# Patient Record
Sex: Female | Born: 1990 | Race: Black or African American | Hispanic: No | Marital: Married | Smoking: Current every day smoker
Health system: Southern US, Community
[De-identification: ages and names within clinical notes are randomized; demographics above are authoritative.]

---

## 2016-12-04 ENCOUNTER — Emergency Department (HOSPITAL_COMMUNITY)
Admission: EM | Admit: 2016-12-04 | Discharge: 2016-12-05 | Disposition: A | Discharge status: Home / Self Care | Payer: Medicaid Other | Attending: Emergency Medicine | Admitting: Emergency Medicine

## 2016-12-04 ENCOUNTER — Encounter (HOSPITAL_COMMUNITY): Payer: Self-pay | Admitting: Emergency Medicine

## 2016-12-04 DIAGNOSIS — Y999 Unspecified external cause status: Secondary | ICD-10-CM | POA: Diagnosis not present

## 2016-12-04 DIAGNOSIS — Y929 Unspecified place or not applicable: Secondary | ICD-10-CM | POA: Diagnosis not present

## 2016-12-04 DIAGNOSIS — S199XXA Unspecified injury of neck, initial encounter: Secondary | ICD-10-CM | POA: Insufficient documentation

## 2016-12-04 DIAGNOSIS — W03XXXA Other fall on same level due to collision with another person, initial encounter: Secondary | ICD-10-CM | POA: Diagnosis not present

## 2016-12-04 DIAGNOSIS — S3992XA Unspecified injury of lower back, initial encounter: Secondary | ICD-10-CM | POA: Diagnosis present

## 2016-12-04 DIAGNOSIS — Y9389 Activity, other specified: Secondary | ICD-10-CM | POA: Diagnosis not present

## 2016-12-04 DIAGNOSIS — S3982XA Other specified injuries of lower back, initial encounter: Secondary | ICD-10-CM | POA: Diagnosis not present

## 2016-12-04 DIAGNOSIS — T148XXA Other injury of unspecified body region, initial encounter: Secondary | ICD-10-CM

## 2016-12-04 DIAGNOSIS — F17210 Nicotine dependence, cigarettes, uncomplicated: Secondary | ICD-10-CM | POA: Diagnosis not present

## 2016-12-04 LAB — POC URINE PREG, ED: Preg Test, Ur: NEGATIVE

## 2016-12-04 MED ORDER — NAPROXEN 250 MG PO TABS
500.0000 mg | ORAL_TABLET | Freq: Once | ORAL | Status: AC
  Administered 2016-12-04: 500 mg via ORAL
  Filled 2016-12-04: qty 2

## 2016-12-04 MED ORDER — METHOCARBAMOL 500 MG PO TABS
500.0000 mg | ORAL_TABLET | Freq: Once | ORAL | Status: AC
  Administered 2016-12-04: 500 mg via ORAL
  Filled 2016-12-04: qty 1
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
## 2016-12-05 ENCOUNTER — Emergency Department (HOSPITAL_COMMUNITY): Payer: Medicaid Other

## 2016-12-05 MED ORDER — NAPROXEN 500 MG PO TABS: 500.0000 mg | ORAL_TABLET | Freq: Two times a day (BID) | ORAL | 0 refills | Status: AC

## 2016-12-05 MED ORDER — METHOCARBAMOL 500 MG PO TABS: 500.0000 mg | ORAL_TABLET | Freq: Two times a day (BID) | ORAL | 0 refills | Status: DC

## 2016-12-05 NOTE — ED Notes (Signed)
Pt to xray

## 2017-01-21 ENCOUNTER — Encounter (HOSPITAL_COMMUNITY): Payer: Self-pay | Admitting: Emergency Medicine

## 2017-01-21 ENCOUNTER — Emergency Department (HOSPITAL_COMMUNITY)
Admission: EM | Admit: 2017-01-21 | Discharge: 2017-01-21 | Disposition: A | Discharge status: Home / Self Care | Payer: Medicaid Other | Attending: Emergency Medicine | Admitting: Emergency Medicine

## 2017-01-21 DIAGNOSIS — G44219 Episodic tension-type headache, not intractable: Secondary | ICD-10-CM | POA: Insufficient documentation

## 2017-01-21 DIAGNOSIS — R5383 Other fatigue: Secondary | ICD-10-CM | POA: Insufficient documentation

## 2017-01-21 DIAGNOSIS — F17210 Nicotine dependence, cigarettes, uncomplicated: Secondary | ICD-10-CM | POA: Insufficient documentation

## 2017-01-21 DIAGNOSIS — R51 Headache: Secondary | ICD-10-CM | POA: Diagnosis present

## 2017-01-21 LAB — I-STAT CHEM 8, ED
BUN: 14 mg/dL (ref 6–20)
CHLORIDE: 109 mmol/L (ref 101–111)
Calcium, Ion: 1.11 mmol/L — ABNORMAL LOW (ref 1.15–1.40)
Creatinine, Ser: 0.7 mg/dL (ref 0.44–1.00)
Glucose, Bld: 76 mg/dL (ref 65–99)
HEMATOCRIT: 40 % (ref 36.0–46.0)
HEMOGLOBIN: 13.6 g/dL (ref 12.0–15.0)
POTASSIUM: 4.3 mmol/L (ref 3.5–5.1)
Sodium: 140 mmol/L (ref 135–145)
TCO2: 24 mmol/L (ref 0–100)

## 2017-01-21 LAB — I-STAT BETA HCG BLOOD, ED (MC, WL, AP ONLY)

## 2017-01-21 MED ORDER — ACETAMINOPHEN 500 MG PO TABS
1000.0000 mg | ORAL_TABLET | Freq: Once | ORAL | Status: AC
  Administered 2017-01-21: 1000 mg via ORAL
  Filled 2017-01-21: qty 2

## 2017-01-21 NOTE — ED Provider Notes (Signed)
[facility]-EMERGENCY DEPT Provider Note   CSN: [id number] Arrival date & time: 01/21/17  0755     History   Chief Complaint Chief Complaint  Patient presents with  . Headache  . Fatigue    HPI Alicia Hopkins is a 26 y.o. female.  The history is provided by the patient.  Headache   This is a recurrent problem. The current episode started yesterday. The problem occurs constantly. The problem has not changed since onset.The headache is associated with an unknown factor. The pain is located in the right unilateral region. The quality of the pain is described as dull. The pain is moderate. The pain does not radiate. Associated symptoms include malaise/fatigue and nausea. Pertinent negatives include no anorexia, no fever, no chest pressure, no near-syncope, no orthopnea, no palpitations, no syncope, no shortness of breath and no vomiting. She has tried aspirin for the symptoms. The treatment provided no relief.   States that she had a small amount to drink Friday and has been feeling fatigued since then. Reports not hydrating well.  LMP 8/1 which lasted 2 weeks until 3 days ago.  History reviewed. No pertinent past medical history.  There are no active problems to display for this patient.   History reviewed. No pertinent surgical history.  OB History    No data available       Home Medications    Prior to Admission medications   Medication Sig Start Date End Date Taking? Authorizing Provider  aspirin 500 MG tablet Take 500 mg by mouth every 6 (six) hours as needed for pain or fever.   Yes [provider]  methocarbamol (ROBAXIN) 500 MG tablet Take 1 tablet (500 mg total) by mouth 2 (two) times daily. Patient not taking: Reported on 01/21/2017 12/05/16   Antony Madura, PA-C  naproxen (NAPROSYN) 500 MG tablet Take 1 tablet (500 mg total) by mouth 2 (two) times daily. Patient not taking: Reported on 01/21/2017 12/05/16   Antony Madura, PA-C    Family History History reviewed.  No pertinent family history.  Social History Social History  Substance Use Topics  . Smoking status: Current Every Day Smoker    Packs/day: 0.50    Types: Cigarettes  . Smokeless tobacco: Never Used  . Alcohol use No     Allergies   Patient has no known allergies.   Review of Systems Review of Systems  Constitutional: Positive for fatigue and malaise/fatigue. Negative for chills and fever.  HENT: Negative for ear pain and sore throat.   Eyes: Negative for pain and visual disturbance.  Respiratory: Negative for cough and shortness of breath.   Cardiovascular: Negative for chest pain, palpitations, orthopnea, syncope and near-syncope.  Gastrointestinal: Positive for nausea. Negative for abdominal pain, anorexia and vomiting.  Genitourinary: Negative for dysuria and hematuria.  Musculoskeletal: Negative for arthralgias and back pain.  Skin: Negative for color change and rash.  Neurological: Positive for headaches. Negative for seizures and syncope.  All other systems reviewed and are negative. All other systems are reviewed and are negative for acute change except as noted in the HPI    Physical Exam Updated Vital Signs BP 123/72   Pulse 61   Temp 97.8 F (36.6 C)   Resp 14   LMP 01/07/2017 (Approximate)   SpO2 100%   Physical Exam  Constitutional: She is oriented to person, place, and time. She appears well-developed and well-nourished. No distress.  HENT:  Head: Normocephalic and atraumatic.  Nose: Nose normal.  Eyes: Pupils are  equal, round, and reactive to light. Conjunctivae and EOM are normal. Right eye exhibits no discharge. Left eye exhibits no discharge. No scleral icterus.  Neck: Normal range of motion. Neck supple.  Cardiovascular: Normal rate and regular rhythm.  Exam reveals no gallop and no friction rub.   No murmur heard. Pulmonary/Chest: Effort normal and breath sounds normal. No stridor. No respiratory distress. She has no rales.  Abdominal: Soft.  She exhibits no distension. There is no tenderness.  Musculoskeletal: She exhibits no edema or tenderness.  Neurological: She is alert and oriented to person, place, and time.  Mental Status: Alert and oriented to person, place, and time. Attention and concentration normal. Speech clear. Recent memory is intact  Cranial Nerves  II Visual Fields: Intact to confrontation. Visual fields intact. III, IV, VI: Pupils equal and reactive to light and near. Full eye movement without nystagmus  V Facial Sensation: Normal. No weakness of masticatory muscles  VII: No facial weakness or asymmetry  VIII Auditory Acuity: Grossly normal  IX/X: The uvula is midline; the palate elevates symmetrically  XI: Normal sternocleidomastoid and trapezius strength  XII: The tongue is midline. No atrophy or fasciculations.   Motor System: Muscle Strength: 5/5 and symmetric in the upper and lower extremities. No pronation or drift.  Muscle Tone: Tone and muscle bulk are normal in the upper and lower extremities.   Reflexes: DTRs: 1+ and symmetrical in all four extremities. Plantar responses are flexor bilaterally.  Coordination: Intact finger-to-nose, heel-to-shin. No tremor.  Sensation: Intact to light touch, and pinprick.  Gait: Routine and tandem gait normal.    Skin: Skin is warm and dry. No rash noted. She is not diaphoretic. No erythema.  Psychiatric: She has a normal mood and affect.  Vitals reviewed.    ED Treatments / Results  Labs (all labs ordered are listed, but only abnormal results are displayed) Labs Reviewed  I-STAT CHEM 8, ED - Abnormal; Notable for the following:       Result Value   Calcium, Ion 1.11 (*)    All other components within normal limits  I-STAT BETA HCG BLOOD, ED ([facility], [facility], [facility] ONLY)    EKG  EKG Interpretation None       Radiology No results found.  Procedures Procedures (including critical care time)  Medications Ordered in ED Medications  acetaminophen (TYLENOL)  tablet 1,000 mg (1,000 mg Oral Given 01/21/17 0851)     Initial Impression / Assessment and Plan / ED Course  I have reviewed the triage vital signs and the nursing notes.  Pertinent labs & imaging results that were available during my care of the patient were reviewed by me and considered in my medical decision making (see chart for details).     Likely related to poor hydration. No anemia from prolonged menstrual cycle.   Non focal neuro exam. No recent head trauma. No fever. Doubt meningitis. Doubt intracranial bleed. Doubt IIH. No indication for imaging.   Given tylenol and oral hydration.  HCG negative.   The patient is safe for discharge with strict return precautions.     Final Clinical Impressions(s) / ED Diagnoses   Final diagnoses:  Other fatigue  Episodic tension-type headache, not intractable    Disposition: Discharge  Condition: Good  I have discussed the results, Dx and Tx plan with the patient who expressed understanding and agree(s) with the plan. Discharge instructions discussed at great length. The patient was given strict return precautions who verbalized understanding of the instructions. No  further questions at time of discharge.    New Prescriptions   No medications on file    Follow Up: [facility] [address] [place] [address] [phone]  Schedule an appointment as soon as possible for a visit  As needed      Taytum Scheck, Amadeo Garnet, MD 01/21/17 1020

## 2017-01-21 NOTE — ED Notes (Signed)
ED Provider at bedside. 

## 2017-01-21 NOTE — ED Triage Notes (Signed)
Pt states that she has had a headache and general fatigue x 3 days. Neuro intact. Alert and oriented.

## 2017-03-03 ENCOUNTER — Encounter (HOSPITAL_COMMUNITY): Payer: Self-pay

## 2017-03-03 ENCOUNTER — Emergency Department (HOSPITAL_COMMUNITY)
Admission: EM | Admit: 2017-03-03 | Discharge: 2017-03-03 | Disposition: A | Discharge status: Home / Self Care | Payer: Medicaid Other | Attending: Emergency Medicine | Admitting: Emergency Medicine

## 2017-03-03 DIAGNOSIS — F17210 Nicotine dependence, cigarettes, uncomplicated: Secondary | ICD-10-CM | POA: Insufficient documentation

## 2017-03-03 DIAGNOSIS — Y929 Unspecified place or not applicable: Secondary | ICD-10-CM | POA: Insufficient documentation

## 2017-03-03 DIAGNOSIS — W269XXA Contact with unspecified sharp object(s), initial encounter: Secondary | ICD-10-CM | POA: Insufficient documentation

## 2017-03-03 DIAGNOSIS — S81812A Laceration without foreign body, left lower leg, initial encounter: Secondary | ICD-10-CM | POA: Insufficient documentation

## 2017-03-03 DIAGNOSIS — Y939 Activity, unspecified: Secondary | ICD-10-CM | POA: Diagnosis not present

## 2017-03-03 DIAGNOSIS — S81811A Laceration without foreign body, right lower leg, initial encounter: Secondary | ICD-10-CM | POA: Diagnosis not present

## 2017-03-03 DIAGNOSIS — S80922A Unspecified superficial injury of left lower leg, initial encounter: Secondary | ICD-10-CM | POA: Diagnosis present

## 2017-03-03 DIAGNOSIS — Y999 Unspecified external cause status: Secondary | ICD-10-CM | POA: Insufficient documentation

## 2017-03-03 MED ORDER — LIDOCAINE HCL (PF) 1 % IJ SOLN
5.0000 mL | Freq: Once | INTRAMUSCULAR | Status: AC
  Administered 2017-03-03: 5 mL via INTRADERMAL
  Filled 2017-03-03: qty 5
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
## 2017-03-03 NOTE — Discharge Instructions (Signed)
Keep sutures clean and dry. Follow-up with urgent care for suture removal in 7-10 days.

## 2018-03-26 ENCOUNTER — Encounter (HOSPITAL_COMMUNITY): Payer: Self-pay | Admitting: *Deleted

## 2018-03-26 ENCOUNTER — Emergency Department (HOSPITAL_COMMUNITY)
Admission: EM | Admit: 2018-03-26 | Discharge: 2018-03-26 | Disposition: A | Discharge status: Home / Self Care | Payer: Medicaid Other | Attending: Emergency Medicine | Admitting: Emergency Medicine

## 2018-03-26 DIAGNOSIS — K529 Noninfective gastroenteritis and colitis, unspecified: Secondary | ICD-10-CM | POA: Insufficient documentation

## 2018-03-26 DIAGNOSIS — Z79899 Other long term (current) drug therapy: Secondary | ICD-10-CM | POA: Diagnosis not present

## 2018-03-26 DIAGNOSIS — F17210 Nicotine dependence, cigarettes, uncomplicated: Secondary | ICD-10-CM | POA: Insufficient documentation

## 2018-03-26 DIAGNOSIS — R112 Nausea with vomiting, unspecified: Secondary | ICD-10-CM | POA: Diagnosis present

## 2018-03-26 LAB — URINALYSIS, ROUTINE W REFLEX MICROSCOPIC
BILIRUBIN URINE: NEGATIVE
Glucose, UA: NEGATIVE mg/dL
HGB URINE DIPSTICK: NEGATIVE
KETONES UR: NEGATIVE mg/dL
Nitrite: NEGATIVE
Protein, ur: 30 mg/dL — AB
Specific Gravity, Urine: 1.02 (ref 1.005–1.030)
pH: 5 (ref 5.0–8.0)

## 2018-03-26 LAB — COMPREHENSIVE METABOLIC PANEL
ALBUMIN: 4.2 g/dL (ref 3.5–5.0)
ALK PHOS: 53 U/L (ref 38–126)
ALT: 10 U/L (ref 0–44)
ANION GAP: 8 (ref 5–15)
AST: 16 U/L (ref 15–41)
BILIRUBIN TOTAL: 0.8 mg/dL (ref 0.3–1.2)
BUN: 5 mg/dL — AB (ref 6–20)
CALCIUM: 9.3 mg/dL (ref 8.9–10.3)
CO2: 25 mmol/L (ref 22–32)
Chloride: 108 mmol/L (ref 98–111)
Creatinine, Ser: 0.92 mg/dL (ref 0.44–1.00)
GFR calc Af Amer: >60 mL/min (ref >60)
GLUCOSE: 82 mg/dL (ref 70–99)
POTASSIUM: 3.3 mmol/L — AB (ref 3.5–5.1)
Sodium: 141 mmol/L (ref 135–145)
TOTAL PROTEIN: 7.4 g/dL (ref 6.5–8.1)

## 2018-03-26 LAB — CBC
HCT: 41.9 % (ref 36.0–46.0)
HEMOGLOBIN: 13.9 g/dL (ref 12.0–15.0)
MCH: 29.8 pg (ref 26.0–34.0)
MCHC: 33.2 g/dL (ref 30.0–36.0)
MCV: 89.7 fL (ref 80.0–100.0)
PLATELETS: 171 10*3/uL (ref 150–400)
RBC: 4.67 MIL/uL (ref 3.87–5.11)
RDW: 13.3 % (ref 11.5–15.5)
WBC: 5.4 10*3/uL (ref 4.0–10.5)
nRBC: 0 % (ref 0.0–0.2)

## 2018-03-26 LAB — LIPASE, BLOOD: Lipase: 32 U/L (ref 11–51)

## 2018-03-26 LAB — I-STAT BETA HCG BLOOD, ED (MC, WL, AP ONLY): I-stat hCG, quantitative: <5 m[IU]/mL (ref <5)

## 2018-03-26 MED ORDER — ONDANSETRON 4 MG PO TBDP
4.0000 mg | ORAL_TABLET | Freq: Once | ORAL | Status: AC
  Administered 2018-03-26: 4 mg via ORAL
  Filled 2018-03-26: qty 1

## 2018-03-26 MED ORDER — ONDANSETRON HCL 4 MG PO TABS: 4.0000 mg | ORAL_TABLET | Freq: Four times a day (QID) | ORAL | 0 refills | Status: AC

## 2018-03-26 MED ORDER — MORPHINE SULFATE (PF) 4 MG/ML IV SOLN: 4.0000 mg | Freq: Once | INTRAVENOUS | Status: DC

## 2018-03-26 NOTE — ED Notes (Signed)
Pt reports vomiting crackers and ginger ale; I did not visualize this as to it being thrown away.

## 2018-03-26 NOTE — ED Provider Notes (Signed)
[facility] EMERGENCY DEPARTMENT Provider Note   CSN: [id number] Arrival date & time: 03/26/18  1146     History   Chief Complaint Chief Complaint  Patient presents with  . Emesis    HPI Alicia Hopkins is a 27 y.o. female.  HPI   27 year old female presents today with complaints of nausea vomiting diarrhea.  Patient notes symptoms started yesterday with 7 episodes of diarrhea, for today.  She notes several episodes of associated vomiting.  She denies any blood in either the vomit or diarrhea.  She denies any associated abdominal pain or urinary symptoms.  No abnormal food or drink or close sick contacts.  She notes she has been unable to tolerate p.o. today.   History reviewed. No pertinent past medical history.  There are no active problems to display for this patient.   History reviewed. No pertinent surgical history.   OB History   None      Home Medications    Prior to Admission medications   Medication Sig Start Date End Date Taking? Authorizing Provider  fluticasone (FLONASE) 50 MCG/ACT nasal spray Place 2 sprays into the nose daily. 06/18/16 09/30/18 Yes [provider]  loratadine (CLARITIN) 10 MG tablet Take 10 mg by mouth daily.   Yes [provider]  methocarbamol (ROBAXIN) 500 MG tablet Take 1 tablet (500 mg total) by mouth 2 (two) times daily. Patient not taking: Reported on 01/21/2017 12/05/16   Antony Madura, PA-C  naproxen (NAPROSYN) 500 MG tablet Take 1 tablet (500 mg total) by mouth 2 (two) times daily. Patient not taking: Reported on 01/21/2017 12/05/16   Antony Madura, PA-C  ondansetron (ZOFRAN) 4 MG tablet Take 1 tablet (4 mg total) by mouth every 6 (six) hours. 03/26/18   Eyvonne Mechanic, PA-C    Family History History reviewed. No pertinent family history.  Social History Social History   Tobacco Use  . Smoking status: Current Every Day Smoker    Packs/day: 0.50    Types: Cigarettes  . Smokeless tobacco:  Never Used  Substance Use Topics  . Alcohol use: No  . Drug use: No     Allergies   Patient has no known allergies.   Review of Systems Review of Systems  All other systems reviewed and are negative.  Physical Exam Updated Vital Signs BP 120/71 (BP Location: Right Arm)   Pulse 81   Temp 97.6 F (36.4 C) (Oral)   Resp 20   SpO2 100%   Physical Exam  Constitutional: She is oriented to person, place, and time. She appears well-developed and well-nourished.  HENT:  Head: Normocephalic and atraumatic.  Eyes: Pupils are equal, round, and reactive to light. Conjunctivae are normal. Right eye exhibits no discharge. Left eye exhibits no discharge. No scleral icterus.  Neck: Normal range of motion. No JVD present. No tracheal deviation present.  Pulmonary/Chest: Effort normal. No stridor.  Abdominal: Soft. She exhibits no distension and no mass. There is no tenderness. There is no rebound and no guarding. No hernia.  Neurological: She is alert and oriented to person, place, and time. Coordination normal.  Skin: Skin is warm.  Psychiatric: She has a normal mood and affect. Her behavior is normal. Judgment and thought content normal.  Nursing note and vitals reviewed.   ED Treatments / Results  Labs (all labs ordered are listed, but only abnormal results are displayed) Labs Reviewed  COMPREHENSIVE METABOLIC PANEL - Abnormal; Notable for the following components:  Result Value   Potassium 3.3 (*)    BUN 5 (*)    All other components within normal limits  URINALYSIS, ROUTINE W REFLEX MICROSCOPIC - Abnormal; Notable for the following components:   APPearance HAZY (*)    Protein, ur 30 (*)    Leukocytes, UA TRACE (*)    Bacteria, UA MANY (*)    All other components within normal limits  LIPASE, BLOOD  CBC  I-STAT BETA HCG BLOOD, ED ([facility], [facility], [facility] ONLY)    EKG None  Radiology No results found.  Procedures Procedures (including critical care time)  Medications  Ordered in ED Medications  ondansetron (ZOFRAN-ODT) disintegrating tablet 4 mg (4 mg Oral Given 03/26/18 1332)     Initial Impression / Assessment and Plan / ED Course  I have reviewed the triage vital signs and the nursing notes.  Pertinent labs & imaging results that were available during my care of the patient were reviewed by me and considered in my medical decision making (see chart for details).     Labs: I stat beta HCG, UA, Lipase, CMP, CBC   Imaging:  Consults:  Therapeutics: Zofran  Discharge Meds: Zofran  Assessment/Plan: 27 year old female presents today with likely viral gastroenteritis.  She has no signs of bacterial infection soft nontender abdomen.  Ordering p.o. after Zofran discharged with outpatient follow-up and strict return precautions.  She verbalized understanding and agreement to today's plan had no further questions or concerns.   Final Clinical Impressions(s) / ED Diagnoses   Final diagnoses:  Gastroenteritis    ED Discharge Orders         Ordered    ondansetron (ZOFRAN) 4 MG tablet  Every 6 hours     03/26/18 1436           Eyvonne Mechanic, PA-C 03/26/18 1645    Pricilla Loveless, MD 03/26/18 2206

## 2018-03-26 NOTE — ED Notes (Signed)
Pt reports relief from nausea but reports having bowel movement.

## 2018-03-26 NOTE — Discharge Instructions (Signed)
Please read attached information. If you experience any new or worsening signs or symptoms please return to the emergency room for evaluation. Please follow-up with your primary care provider or specialist as discussed. Please use medication prescribed only as directed and discontinue taking if you have any concerning signs or symptoms.   °

## 2018-03-26 NOTE — ED Notes (Signed)
PO  Challenge initiated.  Ginger ale and crackers given.

## 2018-03-26 NOTE — ED Triage Notes (Signed)
Pt in c/o n/v/d for the last two days and is now concerned she is dehydrated, feels very weak and tired, no distress noted, denies pain

## 2018-05-01 ENCOUNTER — Emergency Department (HOSPITAL_COMMUNITY): Payer: Medicaid Other

## 2018-05-01 ENCOUNTER — Encounter (HOSPITAL_COMMUNITY): Payer: Self-pay | Admitting: Emergency Medicine

## 2018-05-01 ENCOUNTER — Emergency Department (HOSPITAL_COMMUNITY)
Admission: EM | Admit: 2018-05-01 | Discharge: 2018-05-01 | Disposition: A | Discharge status: Home / Self Care | Payer: Medicaid Other | Attending: Emergency Medicine | Admitting: Emergency Medicine

## 2018-05-01 DIAGNOSIS — Z79899 Other long term (current) drug therapy: Secondary | ICD-10-CM | POA: Diagnosis not present

## 2018-05-01 DIAGNOSIS — W500XXA Accidental hit or strike by another person, initial encounter: Secondary | ICD-10-CM | POA: Insufficient documentation

## 2018-05-01 DIAGNOSIS — S161XXA Strain of muscle, fascia and tendon at neck level, initial encounter: Secondary | ICD-10-CM | POA: Diagnosis not present

## 2018-05-01 DIAGNOSIS — Y999 Unspecified external cause status: Secondary | ICD-10-CM | POA: Diagnosis not present

## 2018-05-01 DIAGNOSIS — Y9383 Activity, rough housing and horseplay: Secondary | ICD-10-CM | POA: Insufficient documentation

## 2018-05-01 DIAGNOSIS — R51 Headache: Secondary | ICD-10-CM | POA: Insufficient documentation

## 2018-05-01 DIAGNOSIS — S199XXA Unspecified injury of neck, initial encounter: Secondary | ICD-10-CM | POA: Diagnosis present

## 2018-05-01 DIAGNOSIS — F17210 Nicotine dependence, cigarettes, uncomplicated: Secondary | ICD-10-CM | POA: Insufficient documentation

## 2018-05-01 DIAGNOSIS — Y929 Unspecified place or not applicable: Secondary | ICD-10-CM | POA: Insufficient documentation

## 2018-05-01 LAB — I-STAT CHEM 8, ED
BUN: 8 mg/dL (ref 6–20)
CREATININE: 0.8 mg/dL (ref 0.44–1.00)
Calcium, Ion: 1.2 mmol/L (ref 1.15–1.40)
Chloride: 106 mmol/L (ref 98–111)
GLUCOSE: 91 mg/dL (ref 70–99)
HCT: 43 % (ref 36.0–46.0)
HEMOGLOBIN: 14.6 g/dL (ref 12.0–15.0)
Potassium: 3.6 mmol/L (ref 3.5–5.1)
Sodium: 142 mmol/L (ref 135–145)
TCO2: 26 mmol/L (ref 22–32)

## 2018-05-01 LAB — CBC WITH DIFFERENTIAL/PLATELET
Abs Immature Granulocytes: 0.02 10*3/uL (ref 0.00–0.07)
Basophils Absolute: 0 10*3/uL (ref 0.0–0.1)
Basophils Relative: 1 %
EOS ABS: 0.1 10*3/uL (ref 0.0–0.5)
Eosinophils Relative: 1 %
HEMATOCRIT: 43.7 % (ref 36.0–46.0)
Hemoglobin: 14.2 g/dL (ref 12.0–15.0)
Immature Granulocytes: 0 %
LYMPHS ABS: 4.3 10*3/uL — AB (ref 0.7–4.0)
Lymphocytes Relative: 62 %
MCH: 28.9 pg (ref 26.0–34.0)
MCHC: 32.5 g/dL (ref 30.0–36.0)
MCV: 89 fL (ref 80.0–100.0)
MONOS PCT: 6 %
Monocytes Absolute: 0.4 10*3/uL (ref 0.1–1.0)
NRBC: 0 % (ref 0.0–0.2)
Neutro Abs: 2.1 10*3/uL (ref 1.7–7.7)
Neutrophils Relative %: 30 %
Platelets: 190 10*3/uL (ref 150–400)
RBC: 4.91 MIL/uL (ref 3.87–5.11)
RDW: 12.5 % (ref 11.5–15.5)
WBC: 6.8 10*3/uL (ref 4.0–10.5)

## 2018-05-01 LAB — I-STAT BETA HCG BLOOD, ED (MC, WL, AP ONLY)

## 2018-05-01 MED ORDER — IOPAMIDOL (ISOVUE-370) INJECTION 76%
75.0000 mL | Freq: Once | INTRAVENOUS | Status: AC | PRN
  Administered 2018-05-01: 75 mL via INTRAVENOUS

## 2018-05-01 MED ORDER — IOPAMIDOL (ISOVUE-370) INJECTION 76%
INTRAVENOUS | Status: AC
  Filled 2018-05-01: qty 100

## 2018-05-01 MED ORDER — METHOCARBAMOL 500 MG PO TABS: 500.0000 mg | ORAL_TABLET | Freq: Two times a day (BID) | ORAL | 0 refills | Status: AC

## 2018-05-01 MED ORDER — HYDROCODONE-ACETAMINOPHEN 5-325 MG PO TABS
1.0000 | ORAL_TABLET | Freq: Once | ORAL | Status: AC
  Administered 2018-05-01: 1 via ORAL
  Filled 2018-05-01: qty 1
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
## 2018-05-01 NOTE — Discharge Instructions (Signed)
You can take Tylenol or Ibuprofen as directed for pain. You can alternate Tylenol and Ibuprofen every 4 hours. If you take Tylenol at 1pm, then you can take Ibuprofen at 5pm. Then you can take Tylenol again at 9pm.   Take Robaxin as prescribed. This medication will make you drowsy so do not drive or drink alcohol when taking it.  He can apply heat to the affected area to help with pain.  Return to emerge department worsening pain, numbness/weakness of your arms/legs, vision changes, difficulty walking or any other worsnieng or concering symptoms.
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
## 2018-10-13 ENCOUNTER — Other Ambulatory Visit: Payer: Self-pay

## 2018-10-13 ENCOUNTER — Encounter (HOSPITAL_COMMUNITY): Payer: Self-pay | Admitting: Emergency Medicine

## 2018-10-13 ENCOUNTER — Emergency Department (HOSPITAL_COMMUNITY)
Admission: EM | Admit: 2018-10-13 | Discharge: 2018-10-13 | Disposition: A | Discharge status: Home / Self Care | Payer: Medicaid Other | Attending: Emergency Medicine | Admitting: Emergency Medicine

## 2018-10-13 DIAGNOSIS — Y999 Unspecified external cause status: Secondary | ICD-10-CM | POA: Insufficient documentation

## 2018-10-13 DIAGNOSIS — Y929 Unspecified place or not applicable: Secondary | ICD-10-CM | POA: Insufficient documentation

## 2018-10-13 DIAGNOSIS — F17210 Nicotine dependence, cigarettes, uncomplicated: Secondary | ICD-10-CM | POA: Insufficient documentation

## 2018-10-13 DIAGNOSIS — S01511A Laceration without foreign body of lip, initial encounter: Secondary | ICD-10-CM | POA: Insufficient documentation

## 2018-10-13 DIAGNOSIS — S0993XA Unspecified injury of face, initial encounter: Secondary | ICD-10-CM | POA: Diagnosis present

## 2018-10-13 DIAGNOSIS — Z79899 Other long term (current) drug therapy: Secondary | ICD-10-CM | POA: Diagnosis not present

## 2018-10-13 DIAGNOSIS — Y939 Activity, unspecified: Secondary | ICD-10-CM | POA: Diagnosis not present

## 2018-10-13 MED ORDER — LIDOCAINE-EPINEPHRINE-TETRACAINE (LET) SOLUTION
3.0000 mL | Freq: Once | NASAL | Status: AC
  Administered 2018-10-13: 3 mL via TOPICAL
  Filled 2018-10-13: qty 3
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
## 2018-10-13 NOTE — ED Notes (Signed)
Dr Kuhner at bedside 
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
## 2019-03-18 ENCOUNTER — Other Ambulatory Visit: Payer: Self-pay

## 2019-03-18 DIAGNOSIS — Z5321 Procedure and treatment not carried out due to patient leaving prior to being seen by health care provider: Secondary | ICD-10-CM | POA: Diagnosis not present

## 2019-03-18 DIAGNOSIS — M79652 Pain in left thigh: Secondary | ICD-10-CM | POA: Diagnosis present

## 2019-03-19 ENCOUNTER — Emergency Department
Admission: EM | Admit: 2019-03-19 | Discharge: 2019-03-19 | Disposition: A | Discharge status: Home / Self Care | Payer: Medicaid Other | Attending: Emergency Medicine | Admitting: Emergency Medicine

## 2019-03-19 ENCOUNTER — Encounter: Payer: Self-pay | Admitting: Emergency Medicine

## 2019-03-19 NOTE — ED Notes (Addendum)
No answer when called several times from lobby 

## 2019-03-19 NOTE — ED Triage Notes (Signed)
Pt presents to ED with left thigh pain. Pt states she heard a pop tonight in her affected leg when moving from sitting to standing quickly. Painful with ambulation and improves when sitting down. Pt denies pain with palpation.

## 2019-03-19 NOTE — ED Notes (Signed)
No answer when called several times from lobby 

## 2020-05-22 ENCOUNTER — Other Ambulatory Visit: Payer: Self-pay

## 2020-05-22 ENCOUNTER — Emergency Department (HOSPITAL_COMMUNITY)
Admission: EM | Admit: 2020-05-22 | Discharge: 2020-05-22 | Disposition: A | Discharge status: Home / Self Care | Payer: Medicaid Other | Attending: Emergency Medicine | Admitting: Emergency Medicine

## 2020-05-22 ENCOUNTER — Encounter (HOSPITAL_COMMUNITY): Payer: Self-pay

## 2020-05-22 DIAGNOSIS — W260XXA Contact with knife, initial encounter: Secondary | ICD-10-CM | POA: Diagnosis not present

## 2020-05-22 DIAGNOSIS — S8991XA Unspecified injury of right lower leg, initial encounter: Secondary | ICD-10-CM | POA: Diagnosis present

## 2020-05-22 DIAGNOSIS — F17210 Nicotine dependence, cigarettes, uncomplicated: Secondary | ICD-10-CM | POA: Insufficient documentation

## 2020-05-22 DIAGNOSIS — S81811A Laceration without foreign body, right lower leg, initial encounter: Secondary | ICD-10-CM | POA: Insufficient documentation
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
## 2020-05-22 NOTE — ED Notes (Signed)
ED Provider at bedside.
